# Patient Record
Sex: Male | Born: 1995 | Smoking: Never smoker
Health system: Southern US, Community
[De-identification: ages and names within clinical notes are randomized; demographics above are authoritative.]

## PROBLEM LIST (undated history)

## (undated) DIAGNOSIS — Z789 Other specified health status: Secondary | ICD-10-CM

## (undated) HISTORY — PX: NO PAST SURGERIES: SHX2092

---

## 2018-08-24 ENCOUNTER — Other Ambulatory Visit: Payer: Self-pay

## 2018-08-24 DIAGNOSIS — Z20822 Contact with and (suspected) exposure to covid-19: Secondary | ICD-10-CM

## 2018-08-25 LAB — NOVEL CORONAVIRUS, NAA: SARS-CoV-2, NAA: NOT DETECTED

## 2018-10-17 ENCOUNTER — Other Ambulatory Visit: Payer: Self-pay | Admitting: Orthopaedic Surgery

## 2018-10-21 ENCOUNTER — Other Ambulatory Visit (HOSPITAL_COMMUNITY)
Admission: RE | Admit: 2018-10-21 | Discharge: 2018-10-21 | Disposition: A | Discharge status: Home / Self Care | Payer: BC Managed Care – PPO | Source: Ambulatory Visit | Attending: Orthopaedic Surgery | Admitting: Orthopaedic Surgery

## 2018-10-21 ENCOUNTER — Other Ambulatory Visit: Payer: Self-pay

## 2018-10-21 ENCOUNTER — Encounter (HOSPITAL_BASED_OUTPATIENT_CLINIC_OR_DEPARTMENT_OTHER): Payer: Self-pay | Admitting: *Deleted

## 2018-10-21 DIAGNOSIS — Z20828 Contact with and (suspected) exposure to other viral communicable diseases: Secondary | ICD-10-CM | POA: Insufficient documentation

## 2018-10-21 DIAGNOSIS — Z01812 Encounter for preprocedural laboratory examination: Secondary | ICD-10-CM | POA: Insufficient documentation

## 2018-10-21 NOTE — Progress Notes (Signed)
UTR pt. LVM with Tatiana at Dr. Pollie Friar office letting her know. Pt needs covid screen today for surgery on Thursday.

## 2018-10-22 LAB — SARS CORONAVIRUS 2 (TAT 6-24 HRS): SARS Coronavirus 2: NEGATIVE

## 2018-10-24 ENCOUNTER — Ambulatory Visit (HOSPITAL_BASED_OUTPATIENT_CLINIC_OR_DEPARTMENT_OTHER): Payer: BC Managed Care – PPO | Admitting: Anesthesiology

## 2018-10-24 ENCOUNTER — Encounter (HOSPITAL_BASED_OUTPATIENT_CLINIC_OR_DEPARTMENT_OTHER): Payer: Self-pay

## 2018-10-24 ENCOUNTER — Ambulatory Visit (HOSPITAL_COMMUNITY): Payer: BC Managed Care – PPO

## 2018-10-24 ENCOUNTER — Ambulatory Visit (HOSPITAL_BASED_OUTPATIENT_CLINIC_OR_DEPARTMENT_OTHER)
Admission: RE | Admit: 2018-10-24 | Discharge: 2018-10-24 | Disposition: A | Discharge status: Home / Self Care | Payer: BC Managed Care – PPO | Attending: Orthopaedic Surgery | Admitting: Orthopaedic Surgery

## 2018-10-24 ENCOUNTER — Encounter (HOSPITAL_BASED_OUTPATIENT_CLINIC_OR_DEPARTMENT_OTHER)
Admission: RE | Disposition: A | Discharge status: Home / Self Care | Payer: Self-pay | Source: Home / Self Care | Attending: Orthopaedic Surgery

## 2018-10-24 ENCOUNTER — Other Ambulatory Visit: Payer: Self-pay

## 2018-10-24 DIAGNOSIS — S82832A Other fracture of upper and lower end of left fibula, initial encounter for closed fracture: Secondary | ICD-10-CM | POA: Diagnosis present

## 2018-10-24 DIAGNOSIS — S93422A Sprain of deltoid ligament of left ankle, initial encounter: Secondary | ICD-10-CM | POA: Insufficient documentation

## 2018-10-24 DIAGNOSIS — S8262XA Displaced fracture of lateral malleolus of left fibula, initial encounter for closed fracture: Secondary | ICD-10-CM | POA: Diagnosis not present

## 2018-10-24 DIAGNOSIS — S93432A Sprain of tibiofibular ligament of left ankle, initial encounter: Secondary | ICD-10-CM | POA: Insufficient documentation

## 2018-10-24 DIAGNOSIS — X58XXXA Exposure to other specified factors, initial encounter: Secondary | ICD-10-CM | POA: Insufficient documentation

## 2018-10-24 DIAGNOSIS — Y9361 Activity, american tackle football: Secondary | ICD-10-CM | POA: Insufficient documentation

## 2018-10-24 DIAGNOSIS — Y998 Other external cause status: Secondary | ICD-10-CM | POA: Insufficient documentation

## 2018-10-24 DIAGNOSIS — Z419 Encounter for procedure for purposes other than remedying health state, unspecified: Secondary | ICD-10-CM

## 2018-10-24 HISTORY — PX: ANKLE RECONSTRUCTION: SHX1151

## 2018-10-24 HISTORY — PX: ANKLE ARTHROSCOPY WITH OPEN REDUCTION INTERNAL FIXATION (ORIF): SHX5582

## 2018-10-24 HISTORY — DX: Other specified health status: Z78.9

## 2018-10-24 HISTORY — PX: SYNDESMOSIS REPAIR: SHX5182

## 2018-10-24 SURGERY — ANKLE ARTHROSCOPY WITH OPEN REDUCTION INTERNAL FIXATION (ORIF)
Anesthesia: General | Site: Ankle | Laterality: Left

## 2018-10-24 MED ORDER — MIDAZOLAM HCL 2 MG/2ML IJ SOLN
1.0000 mg | INTRAMUSCULAR | Status: DC | PRN
  Administered 2018-10-24: 2 mg via INTRAVENOUS

## 2018-10-24 MED ORDER — CELECOXIB 200 MG PO CAPS
ORAL_CAPSULE | ORAL | Status: AC
  Filled 2018-10-24: qty 2

## 2018-10-24 MED ORDER — BUPIVACAINE HCL (PF) 0.25 % IJ SOLN
INTRAMUSCULAR | Status: DC | PRN
  Administered 2018-10-24: 15 mL

## 2018-10-24 MED ORDER — PROPOFOL 500 MG/50ML IV EMUL
INTRAVENOUS | Status: DC | PRN
  Administered 2018-10-24: 25 ug/kg/min via INTRAVENOUS

## 2018-10-24 MED ORDER — POVIDONE-IODINE 10 % EX SWAB: 2.0000 "application " | Freq: Once | CUTANEOUS | Status: DC

## 2018-10-24 MED ORDER — LIDOCAINE 2% (20 MG/ML) 5 ML SYRINGE
INTRAMUSCULAR | Status: DC | PRN
  Administered 2018-10-24: 40 mg via INTRAVENOUS

## 2018-10-24 MED ORDER — MIDAZOLAM HCL 2 MG/2ML IJ SOLN
INTRAMUSCULAR | Status: AC
  Filled 2018-10-24: qty 2

## 2018-10-24 MED ORDER — ONDANSETRON HCL 4 MG/2ML IJ SOLN
INTRAMUSCULAR | Status: AC
  Filled 2018-10-24: qty 2

## 2018-10-24 MED ORDER — DEXAMETHASONE SODIUM PHOSPHATE 4 MG/ML IJ SOLN
INTRAMUSCULAR | Status: DC | PRN
  Administered 2018-10-24: 5 mg via INTRAVENOUS

## 2018-10-24 MED ORDER — METHOCARBAMOL 500 MG PO TABS: 500.0000 mg | ORAL_TABLET | Freq: Three times a day (TID) | ORAL | 0 refills | Status: AC | PRN

## 2018-10-24 MED ORDER — FENTANYL CITRATE (PF) 100 MCG/2ML IJ SOLN: 25.0000 ug | INTRAMUSCULAR | Status: DC | PRN

## 2018-10-24 MED ORDER — LACTATED RINGERS IV SOLN
INTRAVENOUS | Status: DC
  Administered 2018-10-24: 12:00:00 via INTRAVENOUS

## 2018-10-24 MED ORDER — CELECOXIB 400 MG PO CAPS
400.0000 mg | ORAL_CAPSULE | Freq: Once | ORAL | Status: AC
  Administered 2018-10-24: 11:00:00 400 mg via ORAL

## 2018-10-24 MED ORDER — CEFAZOLIN SODIUM-DEXTROSE 2-4 GM/100ML-% IV SOLN
2.0000 g | INTRAVENOUS | Status: AC
  Administered 2018-10-24: 2 g via INTRAVENOUS

## 2018-10-24 MED ORDER — ONDANSETRON HCL 4 MG PO TABS: 4.0000 mg | ORAL_TABLET | Freq: Three times a day (TID) | ORAL | 1 refills | Status: AC | PRN

## 2018-10-24 MED ORDER — FENTANYL CITRATE (PF) 100 MCG/2ML IJ SOLN
INTRAMUSCULAR | Status: AC
  Filled 2018-10-24: qty 2

## 2018-10-24 MED ORDER — OXYCODONE HCL 5 MG PO TABS: 5.0000 mg | ORAL_TABLET | ORAL | 0 refills | Status: AC | PRN

## 2018-10-24 MED ORDER — CEFAZOLIN SODIUM-DEXTROSE 2-4 GM/100ML-% IV SOLN
INTRAVENOUS | Status: AC
  Filled 2018-10-24: qty 100

## 2018-10-24 MED ORDER — FENTANYL CITRATE (PF) 100 MCG/2ML IJ SOLN
50.0000 ug | INTRAMUSCULAR | Status: AC | PRN
  Administered 2018-10-24: 14:00:00 25 ug via INTRAVENOUS
  Administered 2018-10-24: 100 ug via INTRAVENOUS
  Administered 2018-10-24 (×3): 25 ug via INTRAVENOUS

## 2018-10-24 MED ORDER — BUPIVACAINE LIPOSOME 1.3 % IJ SUSP
INTRAMUSCULAR | Status: DC | PRN
  Administered 2018-10-24: 10 mL via PERINEURAL

## 2018-10-24 MED ORDER — PROMETHAZINE HCL 25 MG/ML IJ SOLN: 6.2500 mg | INTRAMUSCULAR | Status: DC | PRN

## 2018-10-24 MED ORDER — ONDANSETRON HCL 4 MG/2ML IJ SOLN
INTRAMUSCULAR | Status: DC | PRN
  Administered 2018-10-24: 4 mg via INTRAVENOUS

## 2018-10-24 MED ORDER — PROPOFOL 10 MG/ML IV BOLUS
INTRAVENOUS | Status: DC | PRN
  Administered 2018-10-24: 200 mg via INTRAVENOUS

## 2018-10-24 MED ORDER — ACETAMINOPHEN 500 MG PO TABS
ORAL_TABLET | ORAL | Status: AC
  Filled 2018-10-24: qty 2

## 2018-10-24 MED ORDER — ACETAMINOPHEN 500 MG PO TABS
1000.0000 mg | ORAL_TABLET | Freq: Once | ORAL | Status: AC
  Administered 2018-10-24: 11:00:00 1000 mg via ORAL

## 2018-10-24 SURGICAL SUPPLY — 89 items
ANCHOR BIO-SUTURETAK 0 FWIRE (Anchor) ×3 IMPLANT
BANDAGE ESMARK 6X9 LF (GAUZE/BANDAGES/DRESSINGS) IMPLANT
BENZOIN TINCTURE PRP APPL 2/3 (GAUZE/BANDAGES/DRESSINGS) IMPLANT
BIT DRILL 2 CANN GRADUATED (BIT) ×3 IMPLANT
BIT DRILL 2.5 CANN ENDOSCOPIC (BIT) ×3 IMPLANT
BIT DRILL 2.7 (BIT) ×2
BIT DRILL 2.7X2.7/3XSCR ANKL (BIT) ×1 IMPLANT
BIT DRL 2.7X2.7/3XSCR ANKL (BIT) ×1
BLADE SURG 15 STRL LF DISP TIS (BLADE) ×5 IMPLANT
BLADE SURG 15 STRL SS (BLADE) ×10
BNDG ELASTIC 6X5.8 VLCR STR LF (GAUZE/BANDAGES/DRESSINGS) ×6 IMPLANT
BNDG ESMARK 4X9 LF (GAUZE/BANDAGES/DRESSINGS) IMPLANT
BNDG ESMARK 6X9 LF (GAUZE/BANDAGES/DRESSINGS)
CHLORAPREP W/TINT 26 (MISCELLANEOUS) ×3 IMPLANT
CLOSURE WOUND 1/2 X4 (GAUZE/BANDAGES/DRESSINGS)
COVER SURGICAL LIGHT HANDLE (MISCELLANEOUS) ×3 IMPLANT
COVER WAND RF STERILE (DRAPES) IMPLANT
CUFF TOURN SGL QUICK 34 (TOURNIQUET CUFF) ×2
CUFF TRNQT CYL 34X4.125X (TOURNIQUET CUFF) ×1 IMPLANT
DECANTER SPIKE VIAL GLASS SM (MISCELLANEOUS) IMPLANT
DRAPE C-ARM 42X72 X-RAY (DRAPES) ×3 IMPLANT
DRAPE C-ARMOR (DRAPES) ×3 IMPLANT
DRAPE EXTREMITY T 121X128X90 (DISPOSABLE) ×3 IMPLANT
DRAPE OEC MINIVIEW 54X84 (DRAPES) IMPLANT
DRAPE U-SHAPE 47X51 STRL (DRAPES) ×3 IMPLANT
DRSG PAD ABDOMINAL 8X10 ST (GAUZE/BANDAGES/DRESSINGS) ×3 IMPLANT
ELECT REM PT RETURN 9FT ADLT (ELECTROSURGICAL)
ELECTRODE REM PT RTRN 9FT ADLT (ELECTROSURGICAL) IMPLANT
EXCALIBUR 3.8MM X 13CM (MISCELLANEOUS) ×3 IMPLANT
GAUZE SPONGE 4X4 12PLY STRL (GAUZE/BANDAGES/DRESSINGS) ×3 IMPLANT
GAUZE XEROFORM 1X8 LF (GAUZE/BANDAGES/DRESSINGS) ×3 IMPLANT
GLOVE BIOGEL M STRL SZ7.5 (GLOVE) ×6 IMPLANT
GLOVE BIOGEL PI IND STRL 7.0 (GLOVE) ×3 IMPLANT
GLOVE BIOGEL PI IND STRL 8 (GLOVE) ×2 IMPLANT
GLOVE BIOGEL PI INDICATOR 7.0 (GLOVE) ×6
GLOVE BIOGEL PI INDICATOR 8 (GLOVE) ×4
GLOVE ECLIPSE 6.5 STRL STRAW (GLOVE) ×9 IMPLANT
GOWN STRL REUS W/ TWL LRG LVL3 (GOWN DISPOSABLE) ×3 IMPLANT
GOWN STRL REUS W/ TWL XL LVL3 (GOWN DISPOSABLE) ×2 IMPLANT
GOWN STRL REUS W/TWL LRG LVL3 (GOWN DISPOSABLE) ×6
GOWN STRL REUS W/TWL XL LVL3 (GOWN DISPOSABLE) ×4
GUIDEWIRE 1.35MM (WIRE) ×6 IMPLANT
IMPL TIGHTROP W/DRV K-LESS (Anchor) ×1 IMPLANT
IMPLANT TIGHTROPE W/DRV K-LESS (Anchor) ×3 IMPLANT
KIT SUTURETAK 2.4 DRILL BIT (KITS) ×3 IMPLANT
NDL SUT 6 .5 CRC .975X.05 MAYO (NEEDLE) IMPLANT
NEEDLE KEITH (NEEDLE) IMPLANT
NEEDLE MAYO TAPER (NEEDLE)
NS IRRIG 1000ML POUR BTL (IV SOLUTION) ×3 IMPLANT
PACK ARTHROSCOPY DSU (CUSTOM PROCEDURE TRAY) ×3 IMPLANT
PACK BASIN DAY SURGERY FS (CUSTOM PROCEDURE TRAY) ×3 IMPLANT
PAD CAST 4YDX4 CTTN HI CHSV (CAST SUPPLIES) ×1 IMPLANT
PADDING CAST COTTON 4X4 STRL (CAST SUPPLIES) ×2
PADDING CAST SYNTHETIC 4 (CAST SUPPLIES) ×4
PADDING CAST SYNTHETIC 4X4 STR (CAST SUPPLIES) ×2 IMPLANT
PENCIL BUTTON HOLSTER BLD 10FT (ELECTRODE) IMPLANT
PLATE ANKLE 98 10H 1/3 TUBUAL (Plate) ×3 IMPLANT
PLATE LOCKING STRAIGHT 10H (Plate) ×3 IMPLANT
SCREW CORTEX LP TM SS 2.7X16 (Screw) ×3 IMPLANT
SCREW CORTICAL 2.7X18 LO PRO (Screw) ×6 IMPLANT
SCREW LOW PROFILE 3.5X14 (Screw) ×3 IMPLANT
SCREW LOW PROFILE 3.5X16 (Screw) ×18 IMPLANT
SCREW NLOCK T15 FT 18X3.5XST (Screw) ×2 IMPLANT
SCREW NON LOCK 3.5X18MM (Screw) ×4 IMPLANT
SHAVER DISSECTOR 3.0 (BURR) IMPLANT
SHAVER SABRE 2.0 (BURR) IMPLANT
SLEEVE SCD COMPRESS KNEE MED (MISCELLANEOUS) ×3 IMPLANT
SPLINT FAST PLASTER 5X30 (CAST SUPPLIES) ×40
SPLINT PLASTER CAST FAST 5X30 (CAST SUPPLIES) ×20 IMPLANT
SPONGE LAP 18X18 RF (DISPOSABLE) ×6 IMPLANT
STAPLER VISISTAT 35W (STAPLE) ×3 IMPLANT
STOCKINETTE 6  STRL (DRAPES) ×2
STOCKINETTE 6 STRL (DRAPES) ×1 IMPLANT
STRAP ANKLE FOOT DISTRACTOR (ORTHOPEDIC SUPPLIES) ×3 IMPLANT
STRIP CLOSURE SKIN 1/2X4 (GAUZE/BANDAGES/DRESSINGS) IMPLANT
SUCTION FRAZIER HANDLE 10FR (MISCELLANEOUS) ×2
SUCTION TUBE FRAZIER 10FR DISP (MISCELLANEOUS) ×1 IMPLANT
SUT BONE WAX W31G (SUTURE) IMPLANT
SUT ETHILON 3 0 PS 1 (SUTURE) ×3 IMPLANT
SUT FIBERWIRE 2-0 18 17.9 3/8 (SUTURE)
SUT MNCRL AB 3-0 PS2 18 (SUTURE) ×3 IMPLANT
SUT PDS AB 2-0 CT2 27 (SUTURE) ×3 IMPLANT
SUT VIC AB 0 SH 27 (SUTURE) IMPLANT
SUT VIC AB 3-0 FS2 27 (SUTURE) IMPLANT
SUTURE FIBERWR 2-0 18 17.9 3/8 (SUTURE) IMPLANT
SYR BULB 3OZ (MISCELLANEOUS) IMPLANT
TOWEL GREEN STERILE FF (TOWEL DISPOSABLE) ×6 IMPLANT
TUBING ARTHROSCOPY IRRIG 16FT (MISCELLANEOUS) ×3 IMPLANT
UNDERPAD 30X36 HEAVY ABSORB (UNDERPADS AND DIAPERS) ×3 IMPLANT

## 2018-10-24 NOTE — H&P (Signed)
Joseph Herrera is an 23 y.o. male.   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  He sustained a left ankle fracture during practice 1 week ago.  He is here today for surgical intervention in the form of open treatment of his lateral malleolus and possible syndesmosis with possible deltoid ligament repair and ankle arthroscopic debridement.  Patient has been maintaining nonweightbearing and splint immobilization.  He has pain in his left ankle that is been improving.  He denies symptomatic numbness or tingling.  Denies any recent fevers or chills.  Past Medical History:  Diagnosis Date  . Medical history non-contributory     Past Surgical History:  Procedure Laterality Date  . NO PAST SURGERIES      History reviewed. No pertinent family history. Social History:  reports that he has never smoked. He has never used smokeless tobacco. He reports current alcohol use. He reports that he does not use drugs.  Allergies: No Known Allergies  Medications Prior to Admission  Medication Sig Dispense Refill  . traMADol (ULTRAM) 50 MG tablet Take by mouth every 6 (six) hours as needed.      No results found for this or any previous visit (from the past 48 hour(s)). No results found.  Review of Systems  Constitutional: Negative.   HENT: Negative.   Eyes: Negative.   Respiratory: Negative.   Cardiovascular: Negative.   Gastrointestinal: Negative.   Musculoskeletal:       Left ankle pain  Skin: Negative.   Neurological: Negative.   Psychiatric/Behavioral: Negative.     Blood pressure (!) 141/72, pulse (!) 43, temperature 98.1 F (36.7 C), resp. rate 12, height 6\' 2"  (1.88 m), weight 92.7 kg, SpO2 100 %. Physical Exam  Vitals reviewed. Constitutional: He appears well-developed.  HENT:  Head: Normocephalic.  Eyes: Conjunctivae are normal.   Neck: Neck supple.  Cardiovascular: Normal rate.  Respiratory: Effort normal.  GI: Soft.  Musculoskeletal:     Comments: Evaluation of the left ankle demonstrates global swelling about the ankle medially, laterally and anteriorly.  He has tenderness to palpation along the lateral ankle as well as medial ankle.  Some anterior tenderness.  I did not assess motion about the ankle due to fracture.  Patient also has some tenderness to palpation along the anterior compartment musculature proximally.  No evidence of compartment syndrome with soft compressible compartments.  Patient endorses sensation to light touch about the dorsal plantar foot and the foot is warm and well-perfused.  No evidence of bilateral upper or right lower extremity injury  Neurological: He is alert.  Skin: Skin is warm.  Psychiatric: He has a normal mood and affect.     Assessment/Plan We will proceed with arthroscopic debridement of his ankle and assessment of stability and ligamentous damage.  We will also perform open treatment of his lateral malleolus and likely syndesmosis with possible deltoid reconstruction.  We discussed the risk, benefits and alternatives of surgery which include but are not limited to wound healing complications, infection, nonunion, malunion, continued pain, instability, stiffness, need for further surgery and damage to surrounding structures.  We also discussed the perioperative anesthetic risk which include death.  He understands the postoperative weightbearing restrictions and agrees to comply.  After weighing the risks and benefits and alternatives he wishes to proceed with surgery.  Erle Crocker, MD 10/24/2018, 12:13 PM

## 2018-10-24 NOTE — Anesthesia Procedure Notes (Signed)
Anesthesia Regional Block: Popliteal block   Pre-Anesthetic Checklist: ,, timeout performed, Correct Patient, Correct Site, Correct Laterality, Correct Procedure, Correct Position, site marked, Risks and benefits discussed,  Surgical consent,  Pre-op evaluation,  At surgeon's request and post-op pain management  Laterality: Left  Prep: chloraprep       Needles:  Injection technique: Single-shot  Needle Type: Echogenic Stimulator Needle          Additional Needles:   Narrative:  Start time: 10/24/2018 11:49 AM End time: 10/24/2018 11:59 AM Injection made incrementally with aspirations every 5 mL.  Performed by: Personally  Anesthesiologist: Duane Boston, MD  Additional Notes: A functioning IV was confirmed and monitors were applied.  Sterile prep and drape, hand hygiene and sterile gloves were used.  Negative aspiration and test dose prior to incremental administration of local anesthetic. The patient tolerated the procedure well.Ultrasound  guidance: relevant anatomy identified, needle position confirmed, local anesthetic spread visualized around nerve(s), vascular puncture avoided.  Image printed for medical record. Adductor Canal Block supplement.

## 2018-10-24 NOTE — Discharge Instructions (Signed)
DR. [facility] SURGERY POST-OP INSTRUCTIONS   Pain Management 1. The numbing medicine and your leg will last around 18 hours, take a dose of your pain medicine as soon as you feel it wearing off to avoid rebound pain. 2. Keep your foot elevated above heart level.  Make sure that your heel hangs free ('floats'). 3. Take all prescribed medication as directed. 4. If taking narcotic pain medication you may want to use an over-the-counter stool softener to avoid constipation. 5. You may take over-the-counter NSAIDs (ibuprofen, naproxen, etc.) as well as over-the-counter acetaminophen as directed on the packaging as a supplement for your pain and may also use it to wean away from the prescription medication.  Activity ? Non-weightbearing ? Keep splint intact  First Postoperative Visit 1. Your first postop visit will be at least 2 weeks after surgery.  This should be scheduled when you schedule surgery. 2. If you do not have a postoperative visit scheduled please call [phone] to schedule an appointment. 3. At the appointment your incision will be evaluated for suture removal, x-rays will be obtained if necessary.  General Instructions 1. Swelling is very common after foot and ankle surgery.  It often takes 3 months for the foot and ankle to begin to feel comfortable.  Some amount of swelling will persist for 6-12 months. 2. DO NOT change the dressing.  If there is a problem with the dressing (too tight, loose, gets wet, etc.) please contact Dr. Pollie Friar office. 3. DO NOT get the dressing wet.  For showers you can use an over-the-counter cast cover or wrap a washcloth around the top of your dressing and then cover it with a plastic bag and tape it to your leg. 4. DO NOT soak the incision (no tubs, pools, bath, etc.) until you have approval from Dr. Lucia Gaskins.  Contact Dr. Huel Cote office or go to Emergency Room if: 1. Temperature above 101 F. 2. Increasing pain that is unresponsive to pain  medication or elevation 3. Excessive redness or swelling in your foot 4. Dressing problems - excessive bloody drainage, looseness or tightness, or if dressing gets wet 5. Develop pain, swelling, warmth, or discoloration of your calf   No Tylenol until 5:15 PM No Ibuprofen until 7:15 PM    Post Anesthesia Home Care Instructions  Activity: Get plenty of rest for the remainder of the day. A responsible individual must stay with you for 24 hours following the procedure.  For the next 24 hours, DO NOT: -Drive a car -Paediatric nurse -Drink alcoholic beverages -Take any medication unless instructed by your physician -Make any legal decisions or sign important papers.  Meals: Start with liquid foods such as gelatin or soup. Progress to regular foods as tolerated. Avoid greasy, spicy, heavy foods. If nausea and/or vomiting occur, drink only clear liquids until the nausea and/or vomiting subsides. Call your physician if vomiting continues.  Special Instructions/Symptoms: Your throat may feel dry or sore from the anesthesia or the breathing tube placed in your throat during surgery. If this causes discomfort, gargle with warm salt water. The discomfort should disappear within 24 hours.  If you had a scopolamine patch placed behind your ear for the management of post- operative nausea and/or vomiting:  1. The medication in the patch is effective for 72 hours, after which it should be removed.  Wrap patch in a tissue and discard in the trash. Wash hands thoroughly with soap and water. 2. You may remove the patch earlier than 72  hours if you experience unpleasant side effects which may include dry mouth, dizziness or visual disturbances. 3. Avoid touching the patch. Wash your hands with soap and water after contact with the patch.      Regional Anesthesia Blocks  1. Numbness or the inability to move the "blocked" extremity may last from 3-48 hours after placement. The length of time depends  on the medication injected and your individual response to the medication. If the numbness is not going away after 48 hours, call your surgeon.  2. The extremity that is blocked will need to be protected until the numbness is gone and the  Strength has returned. Because you cannot feel it, you will need to take extra care to avoid injury. Because it may be weak, you may have difficulty moving it or using it. You may not know what position it is in without looking at it while the block is in effect.  3. For blocks in the legs and feet, returning to weight bearing and walking needs to be done carefully. You will need to wait until the numbness is entirely gone and the strength has returned. You should be able to move your leg and foot normally before you try and bear weight or walk. You will need someone to be with you when you first try to ensure you do not fall and possibly risk injury.  4. Bruising and tenderness at the needle site are common side effects and will resolve in a few days.  5. Persistent numbness or new problems with movement should be communicated to the surgeon or the [facility] [phone] [facility] [phone]).    Information for Discharge Teaching: EXPAREL (bupivacaine liposome injectable suspension)   Your surgeon or anesthesiologist gave you EXPAREL(bupivacaine) to help control your pain after surgery.   EXPAREL is a local anesthetic that provides pain relief by numbing the tissue around the surgical site.  EXPAREL is designed to release pain medication over time and can control pain for up to 72 hours.  Depending on how you respond to EXPAREL, you may require less pain medication during your recovery.  Possible side effects:  Temporary loss of sensation or ability to move in the area where bupivacaine was injected.  Nausea, vomiting, constipation  Rarely, numbness and tingling in your mouth or lips, lightheadedness, or anxiety may  occur.  Call your doctor right away if you think you may be experiencing any of these sensations, or if you have other questions regarding possible side effects.  Follow all other discharge instructions given to you by your surgeon or nurse. Eat a healthy diet and drink plenty of water or other fluids.  If you return to the hospital for any reason within 96 hours following the administration of EXPAREL, it is important for health care providers to know that you have received this anesthetic. A teal colored band has been placed on your arm with the date, time and amount of EXPAREL you have received in order to alert and inform your health care providers. Please leave this armband in place for the full 96 hours following administration, and then you may remove the band.

## 2018-10-24 NOTE — Progress Notes (Signed)
Assisted Dr. Singer with left, ultrasound guided, popliteal/saphenous block. Side rails up, monitors on throughout procedure. See vital signs in flow sheet. Tolerated Procedure well. 

## 2018-10-24 NOTE — Transfer of Care (Signed)
Immediate Anesthesia Transfer of Care Note  Patient: Brandon Wiechman  Procedure(s) Performed: ANKLE ARTHROSCOPY WITH OPEN REDUCTION INTERNAL FIXATION (ORIF) Left Fibula (Left Ankle) RECONSTRUCTION ANKLE (Left Ankle) SYNDESMOSIS REPAIR (Left Ankle)  Patient Location: PACU  Anesthesia Type:GA combined with regional for post-op pain  Level of Consciousness: awake, alert  and oriented  Airway & Oxygen Therapy: Patient Spontanous Breathing and Patient connected to face mask oxygen  Post-op Assessment: Report given to RN and Post -op Vital signs reviewed and stable  Post vital signs: Reviewed and stable  Last Vitals:  Vitals Value Taken Time  BP 177/149 10/24/18 1631  Temp    Pulse 95 10/24/18 1633  Resp 13 10/24/18 1633  SpO2 100 % 10/24/18 1633  Vitals shown include unvalidated device data.  Last Pain:  Vitals:   10/24/18 1102  PainSc: 7       Patients Stated Pain Goal: 8 (59/56/38 7564)  Complications: No apparent anesthesia complications

## 2018-10-24 NOTE — Anesthesia Postprocedure Evaluation (Signed)
Anesthesia Post Note  Patient: Joseph Herrera  Procedure(s) Performed: ANKLE ARTHROSCOPY WITH OPEN REDUCTION INTERNAL FIXATION (ORIF) Left Fibula (Left Ankle) RECONSTRUCTION ANKLE (Left Ankle) SYNDESMOSIS REPAIR (Left Ankle)     Patient location during evaluation: PACU Anesthesia Type: General Level of consciousness: sedated Pain management: pain level controlled Vital Signs Assessment: post-procedure vital signs reviewed and stable Respiratory status: spontaneous breathing and respiratory function stable Cardiovascular status: stable Postop Assessment: no apparent nausea or vomiting Anesthetic complications: no    Last Vitals:  Vitals:   10/24/18 1715 10/24/18 1738  BP: 131/71 (!) 156/91  Pulse: 66 65  Resp: 12 15  Temp:  37.1 C  SpO2: 100% 100%    Last Pain:  Vitals:   10/24/18 1738  PainSc: 0-No pain                 Reiley Bertagnolli DANIEL

## 2018-10-24 NOTE — Anesthesia Preprocedure Evaluation (Addendum)
Anesthesia Evaluation  Patient identified by MRN, date of birth, ID band Patient awake    Reviewed: Allergy & Precautions, NPO status , Patient's Chart, lab work & pertinent test results  History of Anesthesia Complications Negative for: history of anesthetic complications  Airway Mallampati: II  TM Distance: >3 FB Neck ROM: Full    Dental no notable dental hx. (+) Dental Advisory Given   Pulmonary neg pulmonary ROS,    Pulmonary exam normal        Cardiovascular negative cardio ROS Normal cardiovascular exam     Neuro/Psych negative neurological ROS     GI/Hepatic negative GI ROS, Neg liver ROS,   Endo/Other  negative endocrine ROS  Renal/GU negative Renal ROS     Musculoskeletal negative musculoskeletal ROS (+)   Abdominal   Peds  Hematology negative hematology ROS (+)   Anesthesia Other Findings Day of surgery medications reviewed with the patient.  Reproductive/Obstetrics                            Anesthesia Physical Anesthesia Plan  ASA: I  Anesthesia Plan: General   Post-op Pain Management:  Regional for Post-op pain   Induction: Intravenous  PONV Risk Score and Plan: 2 and Ondansetron and Dexamethasone  Airway Management Planned: LMA  Additional Equipment:   Intra-op Plan:   Post-operative Plan: Extubation in OR  Informed Consent: I have reviewed the patients History and Physical, chart, labs and discussed the procedure including the risks, benefits and alternatives for the proposed anesthesia with the patient or authorized representative who has indicated his/her understanding and acceptance.     Dental advisory given  Plan Discussed with: CRNA, Anesthesiologist and Surgeon  Anesthesia Plan Comments:        Anesthesia Quick Evaluation

## 2018-10-24 NOTE — Anesthesia Procedure Notes (Signed)
Procedure Name: LMA Insertion Performed by: Radonna Bracher W, CRNA Pre-anesthesia Checklist: Patient identified, Emergency Drugs available, Suction available and Patient being monitored Patient Re-evaluated:Patient Re-evaluated prior to induction Oxygen Delivery Method: Circle system utilized Preoxygenation: Pre-oxygenation with 100% oxygen Induction Type: IV induction Ventilation: Mask ventilation without difficulty LMA: LMA inserted LMA Size: 5.0 Number of attempts: 1 Placement Confirmation: positive ETCO2 Tube secured with: Tape Dental Injury: Teeth and Oropharynx as per pre-operative assessment        

## 2018-10-25 ENCOUNTER — Encounter (HOSPITAL_BASED_OUTPATIENT_CLINIC_OR_DEPARTMENT_OTHER): Payer: Self-pay | Admitting: Orthopaedic Surgery

## 2018-11-02 NOTE — Op Note (Addendum)
Joseph Herrera male 23 y.o. 10/24/2018   PreOperative Diagnosis: Left distal fibula fracture Left syndesmotic disruption Left deltoid ligament disruption  PostOperative Diagnosis: Left ankle distal fibula fracture Left syndesmotic disruption Left deltoid ligament tear Left ankle talar and tibial chondral damage  PROCEDURE: Left ankle arthroscopic debridement, extensive Open reduction internal fixation of left lateral malleolus Open reduction internal fixation of left ankle syndesmosis Left ankle deltoid ligament repair  SURGEON: Dub Mikes, MD  ASSISTANT: None  ANESTHESIA: General with peripheral nerve blockade  FINDINGS: Left ankle Weber C distal fibula fracture with comminution Anteromedial distal tibial chondral defect and kissing lesion on anteromedial talar dome Syndesmotic ligament disruption Deltoid ligament tear with medial gutter interposed soft tissue  IMPLANTS: Arthrex one third tubular plate Tight rope Suture tack  INDICATIONS:23 y.o. 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  This was on the first day of practice of the year.  He had pain and deformity in his ankle and was brought for evaluation after x-rays revealed a high fibular ankle fracture.  MRI was obtained which demonstrated evidence of tearing of the syndesmotic ligaments and deltoid ligament.  Given this he was indicated for arthroscopic debridement of his ankle and assessment of cartilage surfaces with open treatment of his distal fibula and syndesmosis if needed.  Also deltoid ligament reconstruction if the medial space remained unstable after fixation of the fibula.  He understood the risk benefits alternatives of surgery which include but are not limited to wound healing complications, infection, nonunion, malunion, need for further surgery as well as damage to surrounding structures, continued pain and development of arthritis.  After weighing these  risks he opted to proceed with surgery.  He also understood the perioperative and anesthetic risk which include death and the perioperative weightbearing restrictions which she agreed to comply with.  PROCEDURE:Patient was identified in the preoperative holding area.  The left leg was marked myself.  The consent was signed myself and the patient.  Peripheral nerve blockade was performed by anesthesia.  She was taken the operative suite placed supine operative table.  Thigh tourniquet was placed on the left thigh after LMA anesthesia was induced without difficulty.  Preoperative antibiotics were given.  A bump was placed on the left hip and all bony prominences were well-padded.  The left lower extremity was prepped and draped in the usual sterile fashion.  Surgical timeout was performed we began by elevating the limb and inflating the tourniquet to 250 mmHg.    The ankle distractor was placed.  It was placed under good tension.  Then an anteromedial ankle portal was created for the arthroscopic portion of the procedure.  This was taken sharply down through skin.  Then blunt dissection was used through the subcutaneous tissue to the joint capsule and the capsule was entered.  The arthroscope was placed and there is a large amount of hematoma and synovitis.  A lateral portal was created in a similar fashion.  The shaver was placed and the hematoma and fibrinous tissue within the anterior ankle joint was debrided extensively.  Then the joint surfaces were inspected.  There was evidence of an anteromedial cartilage injury that was not full-thickness.  There is also a kissing lesion on the anteromedial aspect of the talar dome that was not full-thickness as well.  These were both debrided with a shaver back to stable cartilage base.  The medial gutter was inspected and there is found to be a full-thickness deltoid ligament disruption with  instability on testing.  The lateral gutter was inspected and there is found to  be disruption of the syndesmotic ligaments with the ability to prove instability about the syndesmosis with the arthroscope.  The fracture hematoma and synovitic tissue was extensively debrided and cleared of debris.  The arthroscope and shaver were removed and the ankle distractor was removed.  The scope was replaced within the joint and stability was again tested.  There was found to be gross instability medially with talar tilt testing as well as within the syndesmosis.  The scope was removed.  We then turned our attention to the lateral malleolus.  An incision was made longitudinally along the fibula overlying the fracture site.  This was taken sharply down through skin and subcutaneous tissue.  Blunt dissection was used to identify the superficial peroneal nerve which was protected through the case.  The fracture site was identified and the proximal and distal aspects were mobilized sharply.  There was found to be comminution at the fracture site with a posterior medial fragment.  The fracture was reduced under direct visualization and held provisionally with a lobster claw.  K wire was placed to hold the fixation.  The fixation did not allow for leg screw fixation.  Then a one third tubular plate was placed at the fracture site which provided stability and maintained the reduction.  Then an anterior soft tissue flap was created at the level of the syndesmosis and the syndesmosis ligaments were identified and found to be torn anteriorly.  The syndesmosis was visualized directly and the incisura was cleared of any debris.  Then the syndesmosis was directly reduced and held provisionally with a Weber clamp.  A tight rope was placed across the syndesmosis through the plate.  This provided good syndesmotic stability on direct testing.  We then used intraoperative fluoroscopy to stress the medial side of the ankle.  Upon stressing it was noted to be gapping medially with talar tilt.  Therefore we turned our  attention to the medial side an incision was made overlying the medial malleolus and superficial deltoid.  As we went down sharply through the skin and subcutaneous tissue it was noted that there was a complete avulsion of the superficial deltoid off of the medial malleolus.  This tissue was freed and mobilized.  Then the medial aspect of the ankle joint was inspected and interposed soft tissue was further removed from the medial gutter.  Then using a suture tack a drill tunnel was placed within the medial malleolus and the tack was placed.  Then the superficial deltoid tissue was imbricated and repaired to the medial malleolus at the site of its avulsion.  After this the ankle was then restressed under fluoroscopy and found to be stable with regard to medial clear space widening and tilt and syndesmotic stress.  Final films were obtained.  All the wounds were irrigated copiously with normal saline.  The deep tissue was closed with a 2-0 PDS suture.  The tourniquet was released and hemostasis was obtained.  Then the subcuticular tissue was closed with a 3-0 Monocryl.  Staples were used for the skin.  The patient was placed in a soft dressing and a nonweightbearing short leg splint.  He was awakened from anesthesia and taken to recovery in stable condition.  He tolerated procedure well.  There were no complications.  POST OPERATIVE INSTRUCTIONS: Nonweightbearing to left lower extremity Keep splint dry and limb elevated He will follow-up in 2 weeks for splint removal, wound  check and removal of sutures if appropriate.  Nonweightbearing x-rays of the left ankle on arrival.  TOURNIQUET TIME: 2 hours  BLOOD LOSS:  less than 50 mL         DRAINS: none         SPECIMEN: none       COMPLICATIONS:  * No complications entered in OR log *         Disposition: PACU - hemodynamically stable.         Condition: stable

## 2019-01-15 ENCOUNTER — Other Ambulatory Visit: Payer: Self-pay

## 2019-01-15 DIAGNOSIS — Z20822 Contact with and (suspected) exposure to covid-19: Secondary | ICD-10-CM

## 2019-01-17 LAB — NOVEL CORONAVIRUS, NAA: SARS-CoV-2, NAA: NOT DETECTED

## 2019-04-03 ENCOUNTER — Ambulatory Visit: Payer: BC Managed Care – PPO | Attending: Family

## 2019-04-03 DIAGNOSIS — Z23 Encounter for immunization: Secondary | ICD-10-CM

## 2019-04-03 NOTE — Progress Notes (Signed)
   Covid-19 Vaccination [facility]  Name:  Kymere Fullington    MRN: [id number] DOB: 1995-08-04  04/03/2019  Mr. Mcgilvery was observed post Covid-19 immunization for 15 minutes without incident. He was provided with Vaccine Information Sheet and instruction to access the V-Safe system.   Mr. Prajapati was instructed to call 911 with any severe reactions post vaccine: Marland Kitchen Difficulty breathing  . Swelling of face and throat  . A fast heartbeat  . A bad rash all over body  . Dizziness and weakness   Immunizations Administered    Name Date Dose VIS Date Route   Moderna COVID-19 Vaccine 04/03/2019  2:44 PM 0.5 mL 12/10/2018 Intramuscular   Manufacturer: Moderna   Lot: [id number]   NDC: [id number]

## 2019-05-06 ENCOUNTER — Ambulatory Visit: Payer: BC Managed Care – PPO | Attending: Family

## 2019-05-06 DIAGNOSIS — Z23 Encounter for immunization: Secondary | ICD-10-CM

## 2019-05-06 NOTE — Progress Notes (Signed)
   Covid-19 Vaccination Clinic  Name:  Joseph Herrera    MRN: [id number] DOB: [birth year]  05/06/2019  [name] was observed post Covid-19 immunization for 15 minutes without incident. He was provided with Vaccine Information Sheet and instruction to access the V-Safe system.   [name] was instructed to call 911 with any severe reactions post vaccine: Marland Kitchen Difficulty breathing  . Swelling of face and throat  . A fast heartbeat  . A bad rash all over body  . Dizziness and weakness   Immunizations Administered    Name Date Dose VIS Date Route   Moderna COVID-19 Vaccine 05/06/2019 10:15 AM 0.5 mL 12/2018 Intramuscular   Manufacturer: Moderna   Lot: [id number]   NDC: 35597-416-38

## 2020-02-06 ENCOUNTER — Other Ambulatory Visit: Payer: Self-pay | Admitting: Orthopaedic Surgery

## 2020-02-09 ENCOUNTER — Other Ambulatory Visit: Payer: Self-pay | Admitting: Orthopaedic Surgery

## 2020-02-09 DIAGNOSIS — M25572 Pain in left ankle and joints of left foot: Secondary | ICD-10-CM

## 2020-02-10 ENCOUNTER — Ambulatory Visit
Admission: RE | Admit: 2020-02-10 | Discharge: 2020-02-10 | Disposition: A | Discharge status: Home / Self Care | Payer: No Typology Code available for payment source | Source: Ambulatory Visit | Attending: Orthopaedic Surgery | Admitting: Orthopaedic Surgery

## 2020-02-10 DIAGNOSIS — M25572 Pain in left ankle and joints of left foot: Secondary | ICD-10-CM

## 2022-07-01 IMAGING — CT CT ANKLE*L* W/O CM
3 series · 12 of 35 positions shown, 14 images · non-contrast
Comparison: Multiple prior radiographs dating back to November 2018. The most recent x-rays are 02/06/2020

CLINICAL DATA: Left ankle pain. History of previous fracture and
surgical fixation.

EXAM:
CT OF THE LEFT ANKLE WITHOUT CONTRAST
TECHNIQUE: Multidetector CT imaging of the left ankle was performed according
to the standard protocol. Multiplanar CT image reconstructions were
also generated.

[Series 5: sfov lower extremity 2.00 br40 s3 soft · axial · 0.27mm/px · z∈[+570,+726]mm · 4 of 114 slices shown, 5 images (1 of 3)]
[im 18/114  soft-tissue]
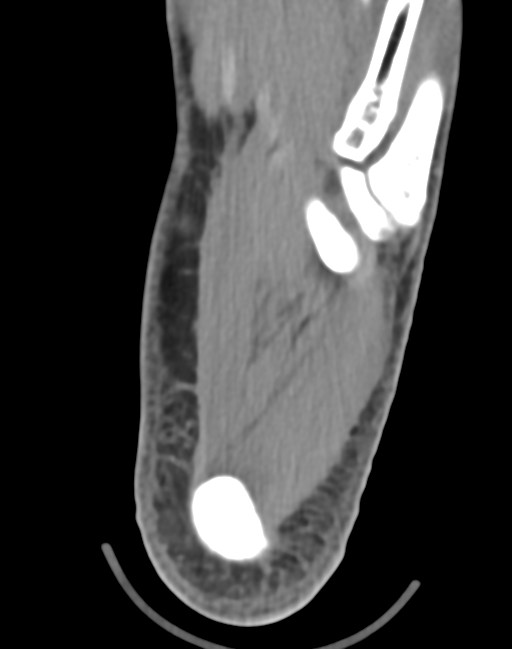
[im 18/114  bone]
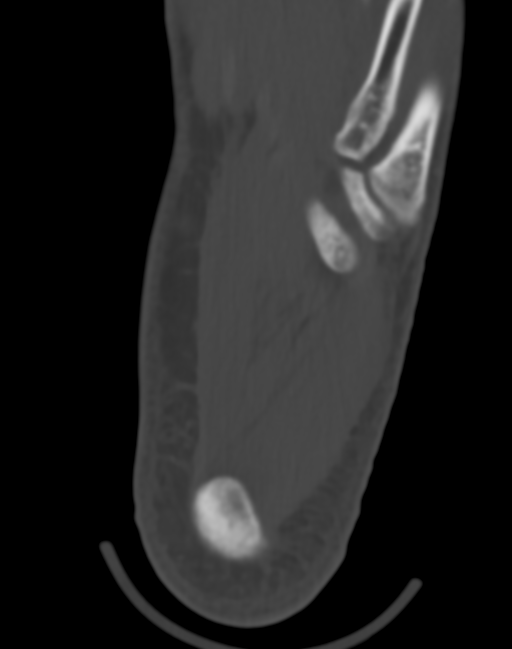
[im 44/114  bone]
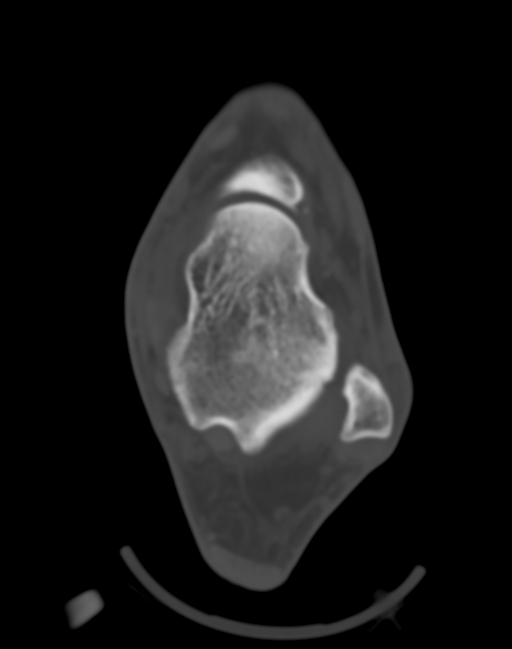
[im 70/114  bone]
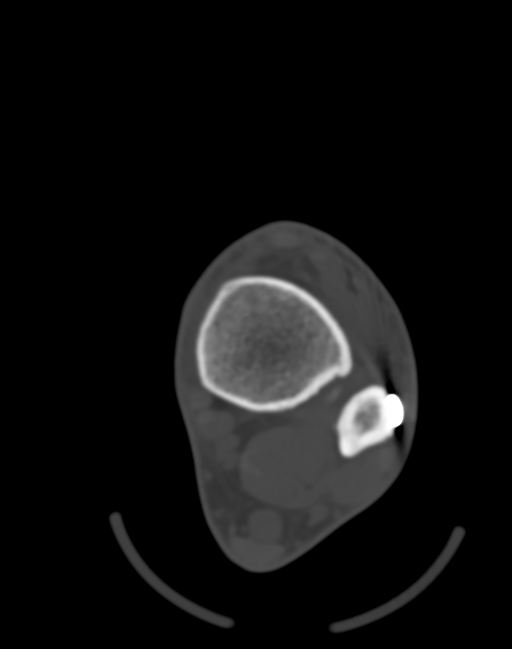
[im 96/114  bone]
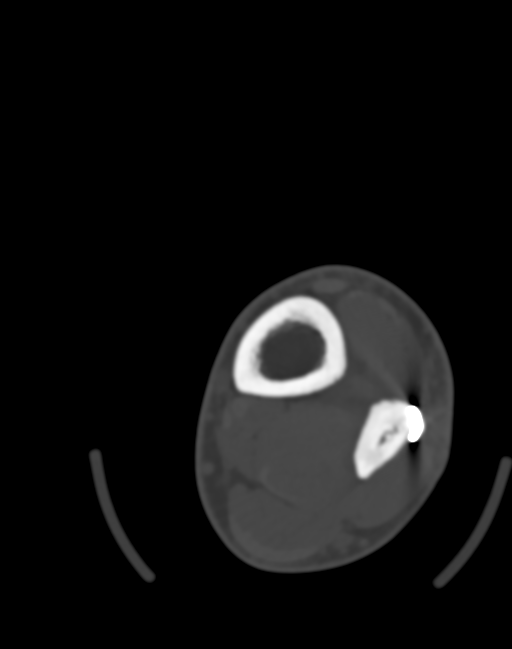

[Series 9: sfov lower extremity 2.00 br40 s3 soft · coronal · 0.27mm/px · 3 of 86 slices shown (2 of 3)]
[im 18/86  bone]
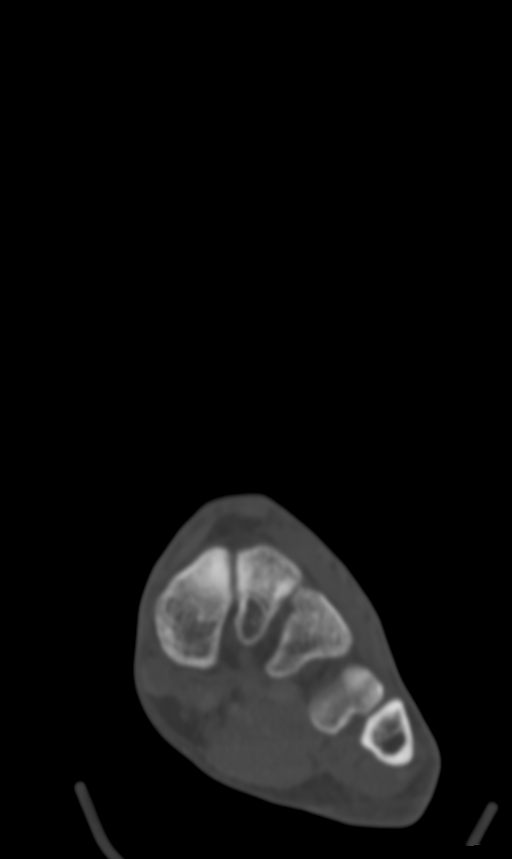
[im 35/86  bone]
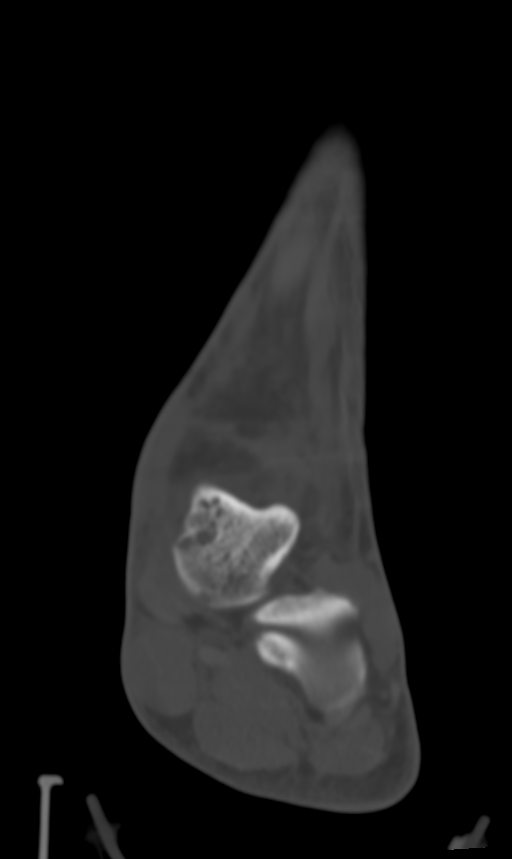
[im 52/86  bone]
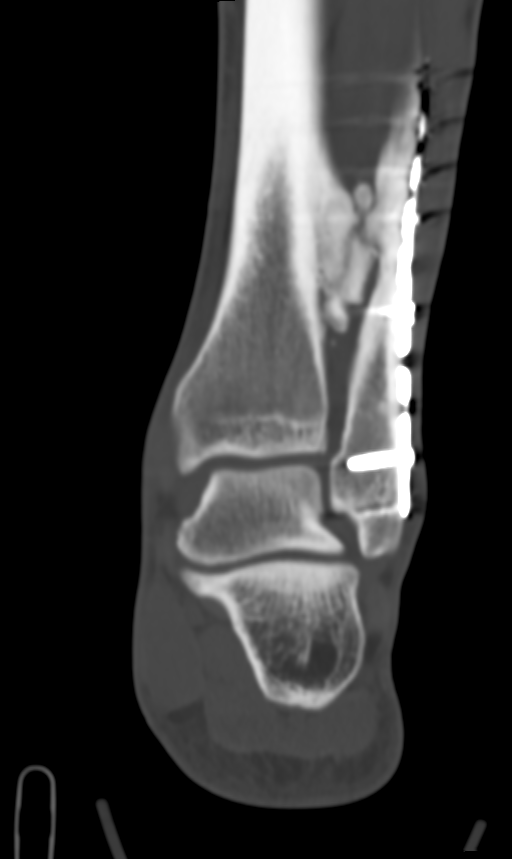

[Series 13: sfov lower extremity 2.00 br40 s3 soft · sagittal · 0.34mm/px · 5 of 68 slices shown, 6 images (3 of 3)]
[im 23/68  bone]
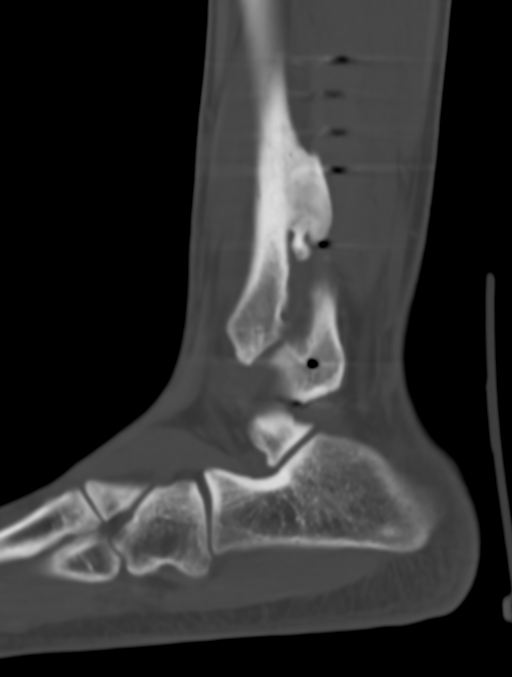
[im 28/68  bone]
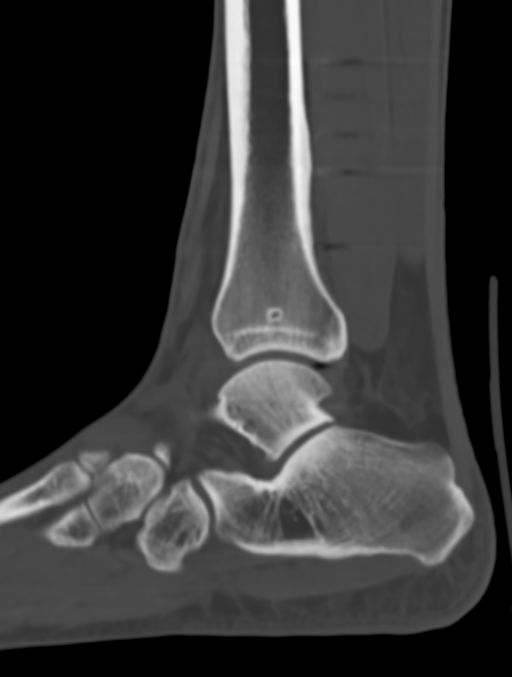
[im 34/68  soft-tissue]
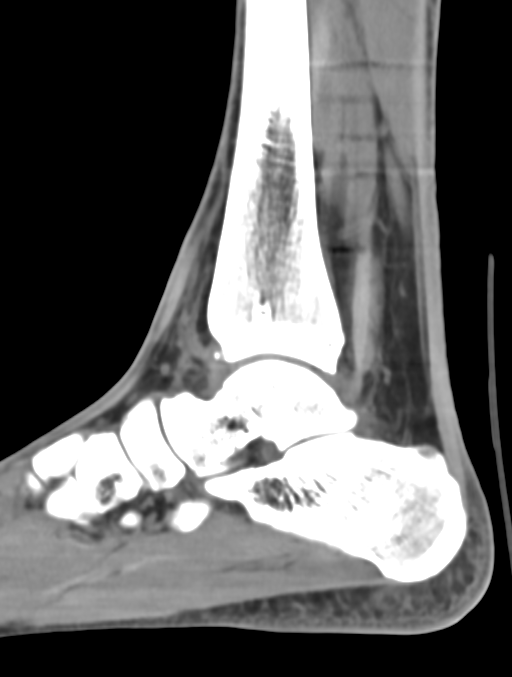
[im 34/68  bone]
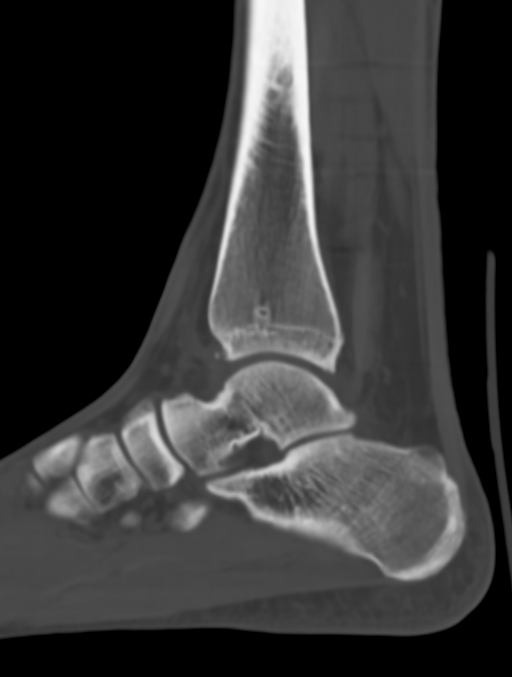
[im 40/68  bone]
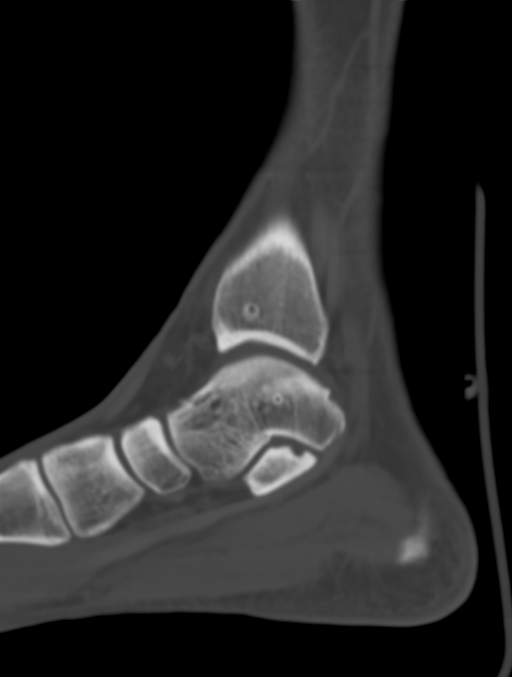
[im 45/68  bone]
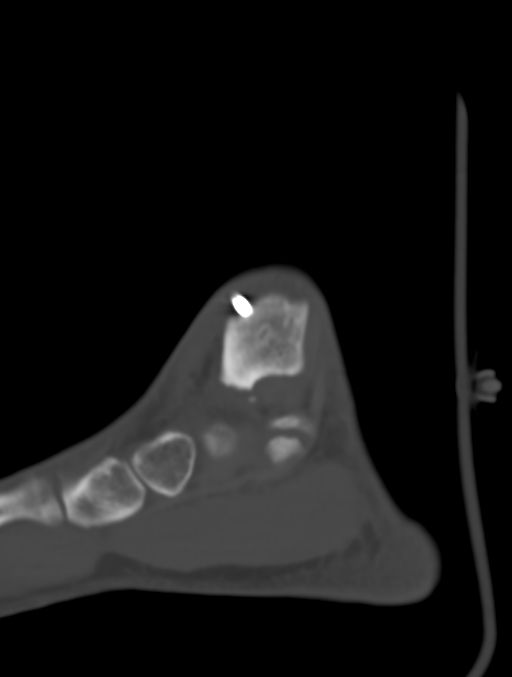

[12 of 35 positions shown; findings below may reference images not displayed]

FINDINGS: There is a long lateral sideplate and compression screws transfixing
the distal fibular fracture. The fracture is completely healed. No
complicating features associated with the hardware are identified.

Prior surgical fixation of the medial malleolus fracture which is
also completely healed. Probable resorbable screws.

There are small smoothly marginated and well corticated avulsion
fractures medial to the medial malleolus. No intra-articular loose
bodies are identified and the ankle mortise is normal. There are
minimal/early tibiotalar and subtalar joint degenerative changes,
likely posttraumatic.

Too small rounded loose bodies are noted in the sinus tarsi which
could be secondary ossification centers or remote avulsion
fractures. There is also mild dorsal spurring from the navicular
bone but no findings for tarsal coalition.

There is a large focus of heterotopic ossification in the
interosseous membrane between the distal fibular and tibial shafts.
This is likely related to prior syndesmotic injury. There are areas
of solid osseous bridging between the lateral cortex of the tibia
and the medial cortex of the fibula.

No significant soft tissue abnormalities are identified by CT. The
major tendons and ligaments are grossly intact.
IMPRESSION: 1. Completely healed distal fibular and medial malleolus fractures.
2. Large focus of heterotopic ossification in the interosseous
membrane between the distal fibular and tibial shafts likely related
to prior syndesmotic injury.
3. Minimal/early tibiotalar and subtalar joint degenerative changes,
likely posttraumatic.
